# Patient Record
Sex: Female | Born: 1985 | Race: White | Hispanic: No | Marital: Married | Smoking: Never smoker
Health system: Southern US, Community
[De-identification: ages and names within clinical notes are randomized; demographics above are authoritative.]

## PROBLEM LIST (undated history)

## (undated) DIAGNOSIS — Z789 Other specified health status: Secondary | ICD-10-CM

## (undated) HISTORY — PX: WISDOM TOOTH EXTRACTION: SHX21

---

## 2009-10-10 ENCOUNTER — Emergency Department (HOSPITAL_COMMUNITY): Admission: EM | Admit: 2009-10-10 | Discharge: 2009-10-11 | Payer: Self-pay | Admitting: Emergency Medicine

## 2009-10-11 ENCOUNTER — Emergency Department (HOSPITAL_COMMUNITY): Admission: EM | Admit: 2009-10-11 | Discharge: 2009-10-11 | Payer: Self-pay | Admitting: Emergency Medicine

## 2009-10-12 ENCOUNTER — Encounter: Payer: Self-pay | Admitting: Emergency Medicine

## 2009-10-12 ENCOUNTER — Inpatient Hospital Stay (HOSPITAL_COMMUNITY): Admission: EM | Admit: 2009-10-12 | Discharge: 2009-10-14 | Payer: Self-pay

## 2010-07-20 LAB — CBC
HCT: 30.2 % — ABNORMAL LOW (ref 36.0–46.0)
HCT: 33 % — ABNORMAL LOW (ref 36.0–46.0)
HCT: 33.5 % — ABNORMAL LOW (ref 36.0–46.0)
Hemoglobin: 11.4 g/dL — ABNORMAL LOW (ref 12.0–15.0)
MCHC: 33.5 g/dL (ref 30.0–36.0)
MCHC: 33.9 g/dL (ref 30.0–36.0)
MCHC: 34.2 g/dL (ref 30.0–36.0)
MCV: 84.8 fL (ref 78.0–100.0)
MCV: 85.2 fL (ref 78.0–100.0)
MCV: 86.2 fL (ref 78.0–100.0)
Platelets: 285 10*3/uL (ref 150–400)
Platelets: 323 10*3/uL (ref 150–400)
Platelets: 336 10*3/uL (ref 150–400)
RBC: 3.34 MIL/uL — ABNORMAL LOW (ref 3.87–5.11)
RBC: 3.51 MIL/uL — ABNORMAL LOW (ref 3.87–5.11)
RDW: 12.6 % (ref 11.5–15.5)
RDW: 12.8 % (ref 11.5–15.5)
WBC: 14.4 10*3/uL — ABNORMAL HIGH (ref 4.0–10.5)

## 2010-07-20 LAB — BASIC METABOLIC PANEL
BUN: 7 mg/dL (ref 6–23)
CO2: 23 mEq/L (ref 19–32)
CO2: 25 mEq/L (ref 19–32)
CO2: 26 mEq/L (ref 19–32)
Calcium: 9.3 mg/dL (ref 8.4–10.5)
Chloride: 104 mEq/L (ref 96–112)
Chloride: 106 mEq/L (ref 96–112)
Creatinine, Ser: 0.66 mg/dL (ref 0.4–1.2)
GFR calc Af Amer: >60 mL/min (ref >60)
GFR calc Af Amer: >60 mL/min (ref >60)
Glucose, Bld: 106 mg/dL — ABNORMAL HIGH (ref 70–99)
Potassium: 3.5 mEq/L (ref 3.5–5.1)
Potassium: 3.7 mEq/L (ref 3.5–5.1)
Sodium: 135 mEq/L (ref 135–145)

## 2010-07-20 LAB — CULTURE, BLOOD (ROUTINE X 2)
Report Status: 6152011
Report Status: 6152011

## 2010-07-20 LAB — DIFFERENTIAL
Basophils Absolute: 0 10*3/uL (ref 0.0–0.1)
Eosinophils Absolute: 0 10*3/uL (ref 0.0–0.7)
Eosinophils Relative: 0 % (ref 0–5)
Eosinophils Relative: 0 % (ref 0–5)
Lymphocytes Relative: 16 % (ref 12–46)
Lymphs Abs: 2.3 10*3/uL (ref 0.7–4.0)
Monocytes Absolute: 0.8 10*3/uL (ref 0.1–1.0)

## 2010-07-20 LAB — CULTURE, ROUTINE-ABSCESS: Culture: NO GROWTH

## 2010-07-20 LAB — URINALYSIS, ROUTINE W REFLEX MICROSCOPIC
Bilirubin Urine: NEGATIVE
Glucose, UA: NEGATIVE mg/dL
Hgb urine dipstick: NEGATIVE
Ketones, ur: NEGATIVE mg/dL
pH: 6 (ref 5.0–8.0)

## 2010-07-20 LAB — ANAEROBIC CULTURE

## 2010-07-20 LAB — PROTIME-INR: Prothrombin Time: 13.8 seconds (ref 11.6–15.2)

## 2011-08-17 IMAGING — CT CT MAXILLOFACIAL W/ CM
3 of 5 series · 16 of 47 positions shown, 19 images · IV contrast (Omnipaque 300)
Comparison: None.

CLINICAL DATA: Left jaw swelling. Fever.

CT MAXILLOFACIAL WITH CONTRAST
TECHNIQUE: Multidetector CT imaging of the maxillofacial
structures was performed with intravenous contrast. Multiplanar CT
image reconstructions were also generated.
Contrast: 75 ml [id number].

[Series 2: max st axial · axial · 0.33mm/px · z∈[+29,+159]mm · 11 of 76 slices shown, 14 images]
[im 7/76  brain]
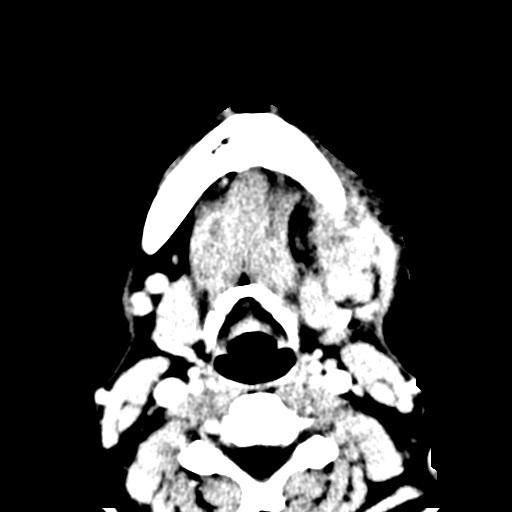
[im 7/76  bone]
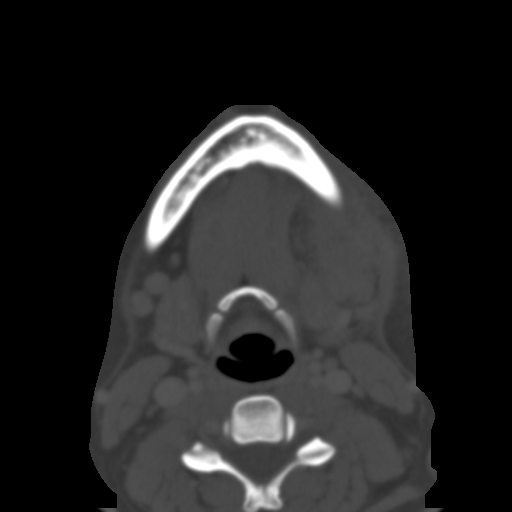
[im 14/76  bone]
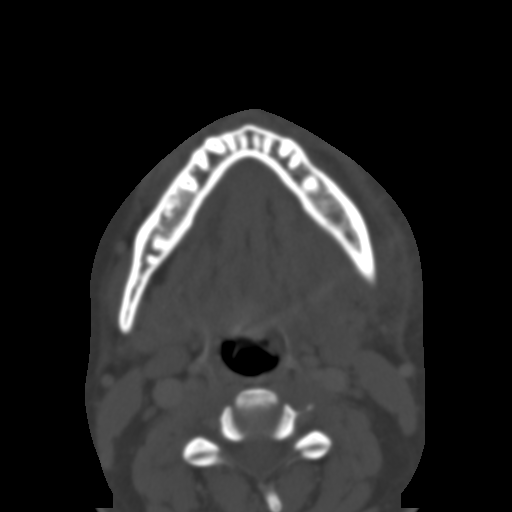
[im 20/76  bone]
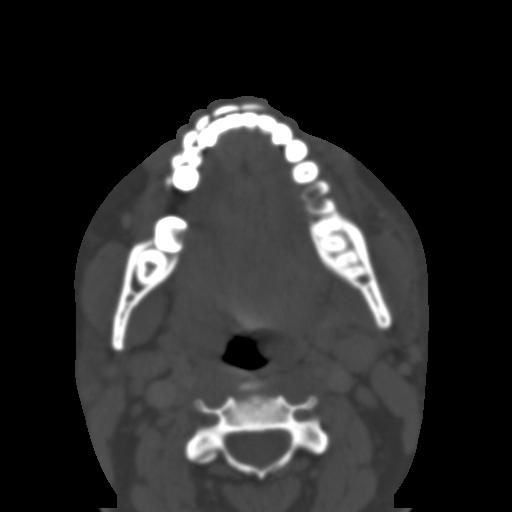
[im 27/76  bone]
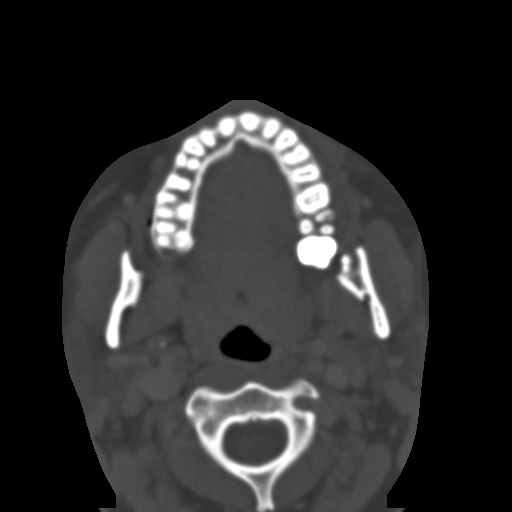
[im 33/76  brain]
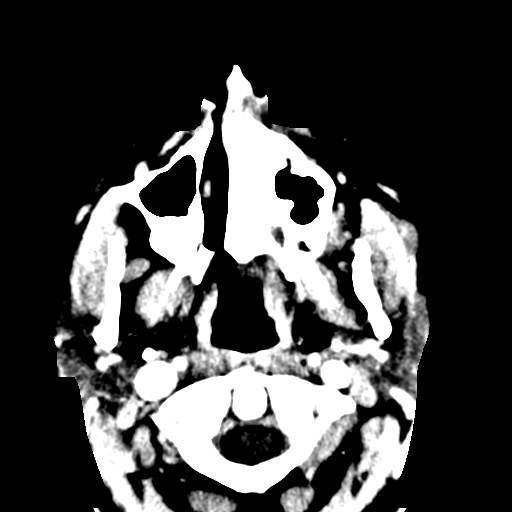
[im 33/76  bone]
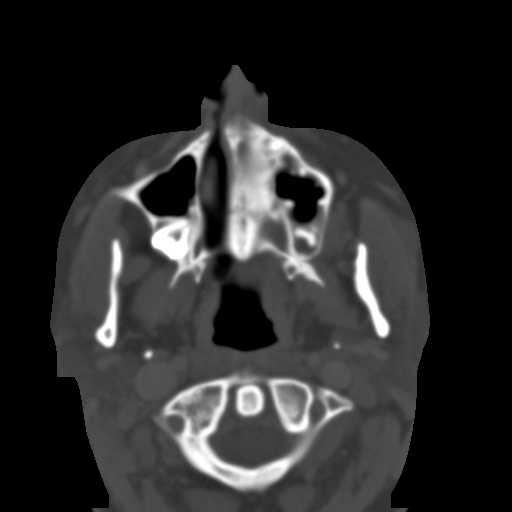
[im 40/76  bone]
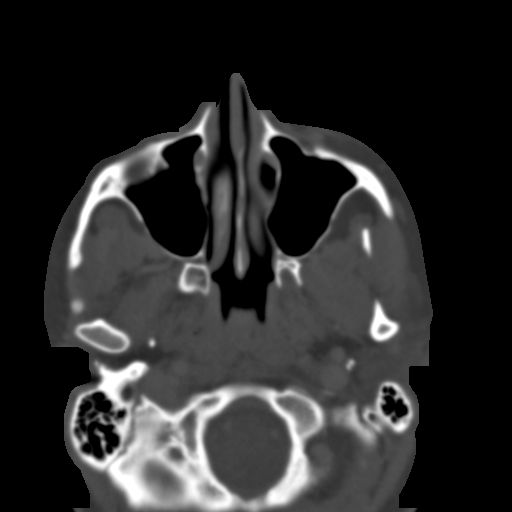
[im 46/76  bone]
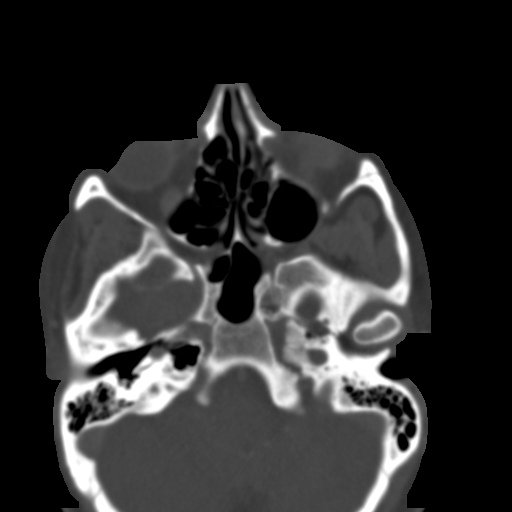
[im 53/76  bone]
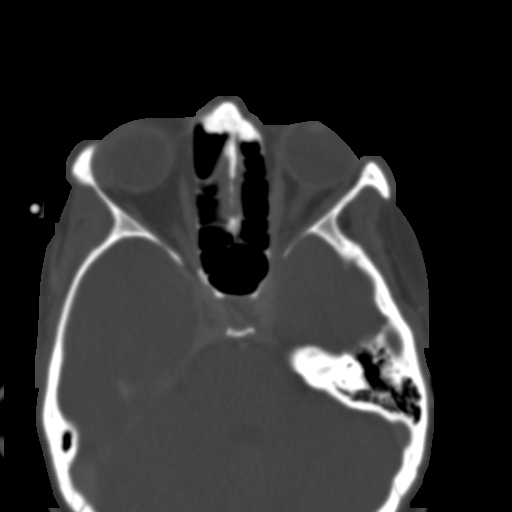
[im 59/76  brain]
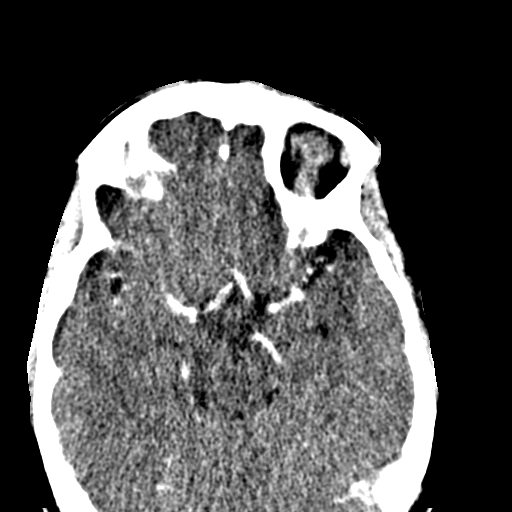
[im 59/76  bone]
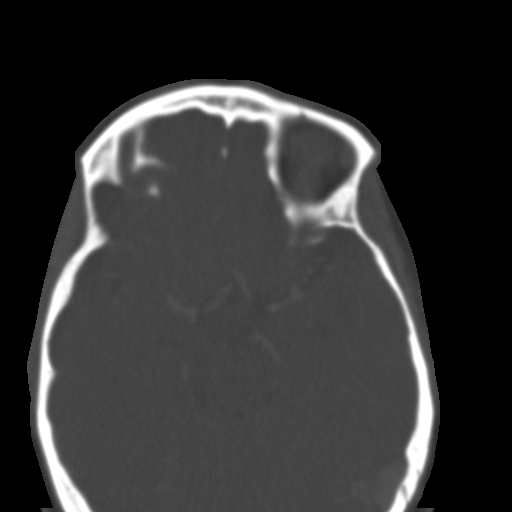
[im 66/76  bone]
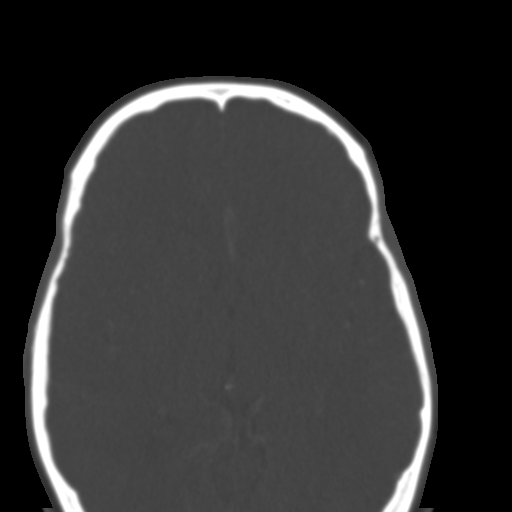
[im 72/76  bone]
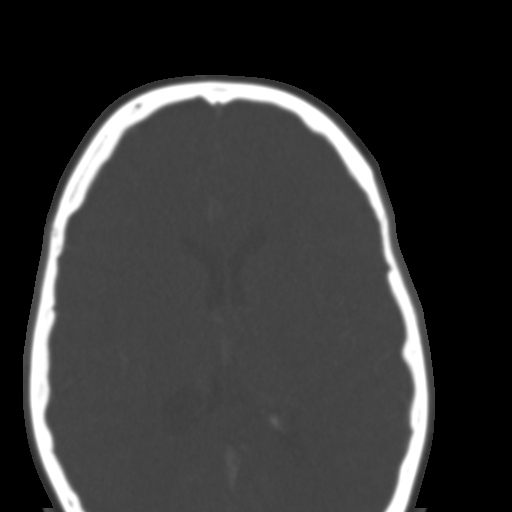

[Series 5: max st coro · coronal · 0.28mm/px · 3 of 79 slices shown]
[im 27/79  bone]
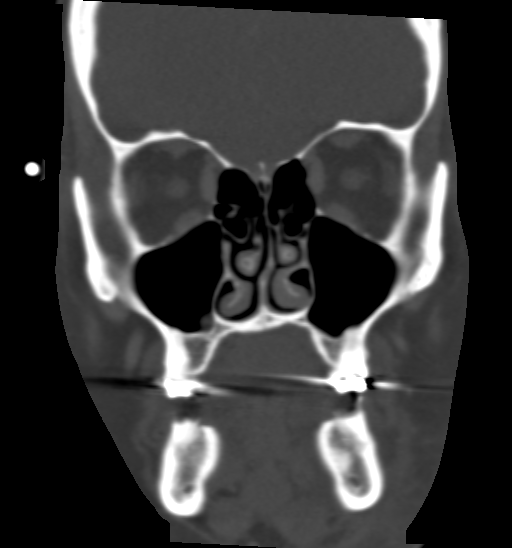
[im 35/79  bone]
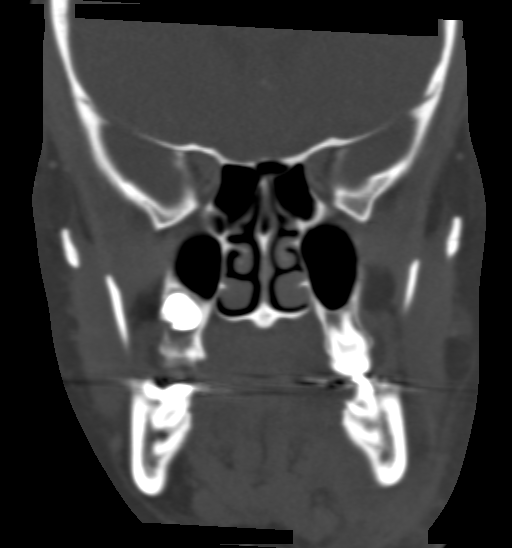
[im 44/79  bone]
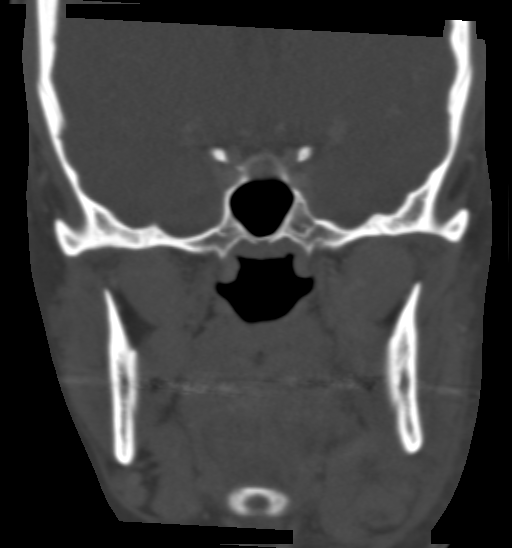

[Series 8: max bone sag · sagittal · 0.30mm/px · 2 of 106 slices shown]
[im 36/106  bone]
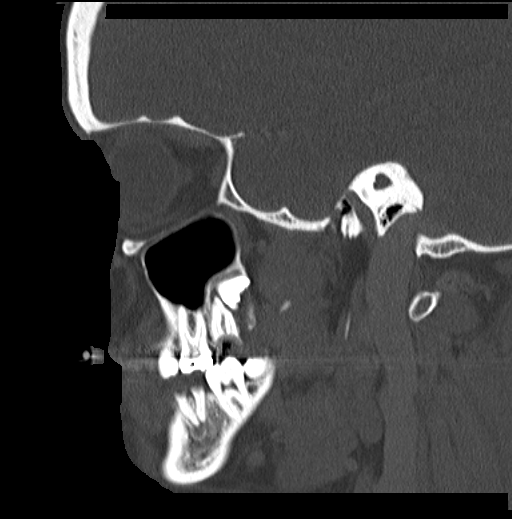
[im 71/106  bone]
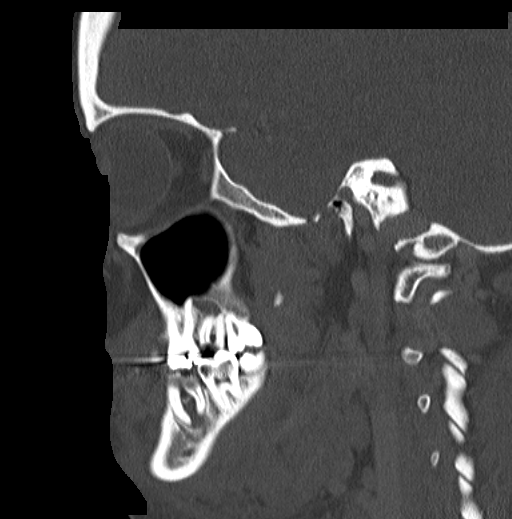

[16 of 47 positions shown; findings below may reference images not displayed]

FINDINGS: There is bilateral periodontal disease with periapical
lucencies throughout the teeth in the mandible, greater on the
left.  There is a focal area of medial mandibular cortical
dehiscence related to the second and third molars on the left
(series 4, images [id number], associated with mild periosteal
reaction.  Inferior and medial to this there is a ovoid soft tissue
density projecting below the mandibular ramus on the left. The left
submandibular gland is displaced medially and posterior.  There is
stranding of the subcutaneous soft tissues.  Airway is patent.
There is no sinus or orbital findings. Cross-sectional measurements
are 31 x 26 by 20 mm.  Findings consistent with left mandibular
osteomyelitis and inferior mandibular abscess related to
periodontal disease.
IMPRESSION: Multiple areas of periapical lucency in the mandible, left greater
than right, with a focal area of left mandibular cortical
dehiscence and periosteal reaction and associated soft tissue
density; findings consistent with left mandibular osteomyelitis and
submandibular abscess.

## 2016-05-03 NOTE — L&D Delivery Note (Signed)
Delivery Note At 1:56 PM a viable female was delivered via Vaginal, Spontaneous NSVD  APGAR: 9 9  Placenta status: spontaneously with 3 vessel cord  Cord:  with the following complications:nuchal cord x 2 .  Cord pH: not obtained 2 small skin tags removed from right side of vulva per patient request Anesthesia:  epidural Episiotomy:  none Lacerations:  periclitoral Suture Repair: 3.0 chromic Est. Blood Loss (mL):  300  Mom to postpartum.  Baby to Couplet care / Skin to Skin.  Nieves Chapa L 04/20/2017, 2:26 PM

## 2016-09-20 LAB — OB RESULTS CONSOLE HIV ANTIBODY (ROUTINE TESTING): HIV: NONREACTIVE

## 2016-09-20 LAB — OB RESULTS CONSOLE ANTIBODY SCREEN: Antibody Screen: NEGATIVE

## 2016-09-20 LAB — OB RESULTS CONSOLE ABO/RH: RH Type: POSITIVE

## 2016-09-20 LAB — OB RESULTS CONSOLE RUBELLA ANTIBODY, IGM: RUBELLA: NON-IMMUNE/NOT IMMUNE

## 2016-09-20 LAB — OB RESULTS CONSOLE GC/CHLAMYDIA
Chlamydia: NEGATIVE
Gonorrhea: NEGATIVE

## 2016-09-20 LAB — OB RESULTS CONSOLE HEPATITIS B SURFACE ANTIGEN: Hepatitis B Surface Ag: NEGATIVE

## 2016-09-20 LAB — OB RESULTS CONSOLE RPR: RPR: NONREACTIVE

## 2017-03-27 ENCOUNTER — Inpatient Hospital Stay (HOSPITAL_COMMUNITY): Admission: AD | Admit: 2017-03-27 | Payer: Self-pay | Source: Ambulatory Visit | Admitting: Obstetrics and Gynecology

## 2017-04-20 ENCOUNTER — Inpatient Hospital Stay (HOSPITAL_COMMUNITY)
Admission: RE | Admit: 2017-04-20 | Discharge: 2017-04-22 | DRG: 768 | Disposition: A | Discharge status: Home / Self Care | Payer: 59 | Source: Ambulatory Visit | Attending: Obstetrics and Gynecology | Admitting: Obstetrics and Gynecology

## 2017-04-20 ENCOUNTER — Inpatient Hospital Stay (HOSPITAL_COMMUNITY): Payer: 59 | Admitting: Anesthesiology

## 2017-04-20 ENCOUNTER — Other Ambulatory Visit: Payer: Self-pay

## 2017-04-20 ENCOUNTER — Encounter (HOSPITAL_COMMUNITY): Payer: Self-pay | Admitting: *Deleted

## 2017-04-20 DIAGNOSIS — L918 Other hypertrophic disorders of the skin: Secondary | ICD-10-CM | POA: Diagnosis present

## 2017-04-20 DIAGNOSIS — Z3A38 38 weeks gestation of pregnancy: Secondary | ICD-10-CM | POA: Diagnosis not present

## 2017-04-20 DIAGNOSIS — O134 Gestational [pregnancy-induced] hypertension without significant proteinuria, complicating childbirth: Principal | ICD-10-CM | POA: Diagnosis present

## 2017-04-20 DIAGNOSIS — O139 Gestational [pregnancy-induced] hypertension without significant proteinuria, unspecified trimester: Secondary | ICD-10-CM | POA: Diagnosis present

## 2017-04-20 HISTORY — DX: Other specified health status: Z78.9

## 2017-04-20 LAB — COMPREHENSIVE METABOLIC PANEL
ALBUMIN: 2.7 g/dL — AB (ref 3.5–5.0)
ALT: 15 U/L (ref 14–54)
AST: 19 U/L (ref 15–41)
Alkaline Phosphatase: 110 U/L (ref 38–126)
Anion gap: 12 (ref 5–15)
BILIRUBIN TOTAL: 0.2 mg/dL — AB (ref 0.3–1.2)
BUN: 11 mg/dL (ref 6–20)
CO2: 18 mmol/L — AB (ref 22–32)
Calcium: 9.2 mg/dL (ref 8.9–10.3)
Chloride: 103 mmol/L (ref 101–111)
Creatinine, Ser: 0.46 mg/dL (ref 0.44–1.00)
GFR calc Af Amer: >60 mL/min (ref >60)
GFR calc non Af Amer: >60 mL/min (ref >60)
GLUCOSE: 78 mg/dL (ref 65–99)
POTASSIUM: 4.2 mmol/L (ref 3.5–5.1)
SODIUM: 133 mmol/L — AB (ref 135–145)
TOTAL PROTEIN: 6 g/dL — AB (ref 6.5–8.1)

## 2017-04-20 LAB — CBC
HEMATOCRIT: 31.1 % — AB (ref 36.0–46.0)
HEMATOCRIT: 31 % — AB (ref 36.0–46.0)
HEMOGLOBIN: 10.4 g/dL — AB (ref 12.0–15.0)
HEMOGLOBIN: 10.2 g/dL — AB (ref 12.0–15.0)
MCH: 29.1 pg (ref 26.0–34.0)
MCH: 28.9 pg (ref 26.0–34.0)
MCHC: 33.4 g/dL (ref 30.0–36.0)
MCHC: 32.9 g/dL (ref 30.0–36.0)
MCV: 87.1 fL (ref 78.0–100.0)
MCV: 87.8 fL (ref 78.0–100.0)
PLATELETS: 258 10*3/uL (ref 150–400)
Platelets: 274 10*3/uL (ref 150–400)
RBC: 3.57 MIL/uL — ABNORMAL LOW (ref 3.87–5.11)
RBC: 3.53 MIL/uL — AB (ref 3.87–5.11)
RDW: 14.3 % (ref 11.5–15.5)
RDW: 14.3 % (ref 11.5–15.5)
WBC: 14.2 10*3/uL — AB (ref 4.0–10.5)
WBC: 12.8 10*3/uL — ABNORMAL HIGH (ref 4.0–10.5)

## 2017-04-20 LAB — TYPE AND SCREEN
ABO/RH(D): O POS
Antibody Screen: NEGATIVE

## 2017-04-20 LAB — ABO/RH: ABO/RH(D): O POS

## 2017-04-20 LAB — RPR: RPR Ser Ql: NONREACTIVE

## 2017-04-20 MED ORDER — DIPHENHYDRAMINE HCL 25 MG PO CAPS: 25.0000 mg | ORAL_CAPSULE | Freq: Four times a day (QID) | ORAL | Status: DC | PRN

## 2017-04-20 MED ORDER — OXYTOCIN 40 UNITS IN LACTATED RINGERS INFUSION - SIMPLE MED: 2.5000 [IU]/h | INTRAVENOUS | Status: DC

## 2017-04-20 MED ORDER — OXYTOCIN 40 UNITS IN LACTATED RINGERS INFUSION - SIMPLE MED
1.0000 m[IU]/min | INTRAVENOUS | Status: DC
  Administered 2017-04-20: 2 m[IU]/min via INTRAVENOUS
  Filled 2017-04-20: qty 1000

## 2017-04-20 MED ORDER — MISOPROSTOL 25 MCG QUARTER TABLET
25.0000 ug | ORAL_TABLET | ORAL | Status: DC | PRN
  Administered 2017-04-20: 25 ug via VAGINAL
  Filled 2017-04-20 (×3): qty 1

## 2017-04-20 MED ORDER — ONDANSETRON HCL 4 MG/2ML IJ SOLN: 4.0000 mg | INTRAMUSCULAR | Status: DC | PRN

## 2017-04-20 MED ORDER — ACETAMINOPHEN 325 MG PO TABS: 650.0000 mg | ORAL_TABLET | ORAL | Status: DC | PRN

## 2017-04-20 MED ORDER — OXYTOCIN BOLUS FROM INFUSION
500.0000 mL | Freq: Once | INTRAVENOUS | Status: AC
  Administered 2017-04-20: 500 mL via INTRAVENOUS

## 2017-04-20 MED ORDER — ZOLPIDEM TARTRATE 5 MG PO TABS: 5.0000 mg | ORAL_TABLET | Freq: Every evening | ORAL | Status: DC | PRN

## 2017-04-20 MED ORDER — LACTATED RINGERS IV SOLN: 500.0000 mL | INTRAVENOUS | Status: DC | PRN

## 2017-04-20 MED ORDER — OXYCODONE-ACETAMINOPHEN 5-325 MG PO TABS: 2.0000 | ORAL_TABLET | ORAL | Status: DC | PRN

## 2017-04-20 MED ORDER — LIDOCAINE HCL (PF) 1 % IJ SOLN
INTRAMUSCULAR | Status: DC | PRN
Start: 2017-04-20 — End: 2017-04-20
  Administered 2017-04-20 (×2): 5 mL

## 2017-04-20 MED ORDER — IBUPROFEN 600 MG PO TABS
600.0000 mg | ORAL_TABLET | Freq: Four times a day (QID) | ORAL | Status: DC
  Administered 2017-04-20 – 2017-04-22 (×8): 600 mg via ORAL
  Filled 2017-04-20 (×8): qty 1

## 2017-04-20 MED ORDER — LIDOCAINE HCL (PF) 1 % IJ SOLN
30.0000 mL | INTRAMUSCULAR | Status: DC | PRN
  Filled 2017-04-20: qty 30

## 2017-04-20 MED ORDER — TETANUS-DIPHTH-ACELL PERTUSSIS 5-2.5-18.5 LF-MCG/0.5 IM SUSP: 0.5000 mL | Freq: Once | INTRAMUSCULAR | Status: DC

## 2017-04-20 MED ORDER — MEDROXYPROGESTERONE ACETATE 150 MG/ML IM SUSP: 150.0000 mg | INTRAMUSCULAR | Status: DC | PRN

## 2017-04-20 MED ORDER — SENNOSIDES-DOCUSATE SODIUM 8.6-50 MG PO TABS
2.0000 | ORAL_TABLET | ORAL | Status: DC
  Administered 2017-04-21 (×2): 2 via ORAL
  Filled 2017-04-20 (×2): qty 2

## 2017-04-20 MED ORDER — LACTATED RINGERS IV SOLN
INTRAVENOUS | Status: DC
  Administered 2017-04-20: 02:00:00 via INTRAVENOUS

## 2017-04-20 MED ORDER — SIMETHICONE 80 MG PO CHEW: 80.0000 mg | CHEWABLE_TABLET | ORAL | Status: DC | PRN

## 2017-04-20 MED ORDER — DIBUCAINE 1 % RE OINT: 1.0000 "application " | TOPICAL_OINTMENT | RECTAL | Status: DC | PRN

## 2017-04-20 MED ORDER — OXYCODONE-ACETAMINOPHEN 5-325 MG PO TABS: 1.0000 | ORAL_TABLET | ORAL | Status: DC | PRN

## 2017-04-20 MED ORDER — BENZOCAINE-MENTHOL 20-0.5 % EX AERO
1.0000 "application " | INHALATION_SPRAY | CUTANEOUS | Status: DC | PRN
  Administered 2017-04-20: 1 via TOPICAL
  Filled 2017-04-20: qty 56

## 2017-04-20 MED ORDER — TERBUTALINE SULFATE 1 MG/ML IJ SOLN
0.2500 mg | Freq: Once | INTRAMUSCULAR | Status: DC | PRN
  Filled 2017-04-20: qty 1

## 2017-04-20 MED ORDER — ONDANSETRON HCL 4 MG/2ML IJ SOLN: 4.0000 mg | Freq: Four times a day (QID) | INTRAMUSCULAR | Status: DC | PRN

## 2017-04-20 MED ORDER — FLEET ENEMA 7-19 GM/118ML RE ENEM: 1.0000 | ENEMA | Freq: Every day | RECTAL | Status: DC | PRN

## 2017-04-20 MED ORDER — WITCH HAZEL-GLYCERIN EX PADS: 1.0000 "application " | MEDICATED_PAD | CUTANEOUS | Status: DC | PRN

## 2017-04-20 MED ORDER — LACTATED RINGERS IV SOLN
500.0000 mL | Freq: Once | INTRAVENOUS | Status: AC
  Administered 2017-04-20: 250 mL via INTRAVENOUS

## 2017-04-20 MED ORDER — PRENATAL MULTIVITAMIN CH
1.0000 | ORAL_TABLET | Freq: Every day | ORAL | Status: DC
  Administered 2017-04-21 – 2017-04-22 (×2): 1 via ORAL
  Filled 2017-04-20 (×2): qty 1

## 2017-04-20 MED ORDER — EPHEDRINE 5 MG/ML INJ
10.0000 mg | INTRAVENOUS | Status: DC | PRN
  Filled 2017-04-20: qty 2

## 2017-04-20 MED ORDER — COCONUT OIL OIL: 1.0000 "application " | TOPICAL_OIL | Status: DC | PRN

## 2017-04-20 MED ORDER — MEASLES, MUMPS & RUBELLA VAC ~~LOC~~ INJ
0.5000 mL | INJECTION | Freq: Once | SUBCUTANEOUS | Status: AC
  Administered 2017-04-22: 0.5 mL via SUBCUTANEOUS
  Filled 2017-04-20 (×2): qty 0.5

## 2017-04-20 MED ORDER — DIPHENHYDRAMINE HCL 50 MG/ML IJ SOLN: 12.5000 mg | INTRAMUSCULAR | Status: DC | PRN

## 2017-04-20 MED ORDER — PHENYLEPHRINE 40 MCG/ML (10ML) SYRINGE FOR IV PUSH (FOR BLOOD PRESSURE SUPPORT)
80.0000 ug | PREFILLED_SYRINGE | INTRAVENOUS | Status: DC | PRN
  Filled 2017-04-20: qty 10
  Filled 2017-04-20: qty 5

## 2017-04-20 MED ORDER — PHENYLEPHRINE 40 MCG/ML (10ML) SYRINGE FOR IV PUSH (FOR BLOOD PRESSURE SUPPORT)
80.0000 ug | PREFILLED_SYRINGE | INTRAVENOUS | Status: DC | PRN
  Filled 2017-04-20: qty 5

## 2017-04-20 MED ORDER — BISACODYL 10 MG RE SUPP: 10.0000 mg | Freq: Every day | RECTAL | Status: DC | PRN

## 2017-04-20 MED ORDER — SOD CITRATE-CITRIC ACID 500-334 MG/5ML PO SOLN
30.0000 mL | ORAL | Status: DC | PRN
  Administered 2017-04-20: 30 mL via ORAL
  Filled 2017-04-20: qty 15

## 2017-04-20 MED ORDER — FENTANYL 2.5 MCG/ML BUPIVACAINE 1/10 % EPIDURAL INFUSION (WH - ANES)
14.0000 mL/h | INTRAMUSCULAR | Status: DC | PRN
  Administered 2017-04-20: 14 mL/h via EPIDURAL
  Filled 2017-04-20: qty 100

## 2017-04-20 MED ORDER — ONDANSETRON HCL 4 MG PO TABS: 4.0000 mg | ORAL_TABLET | ORAL | Status: DC | PRN

## 2017-04-20 NOTE — Anesthesia Postprocedure Evaluation (Signed)
Anesthesia Post Note  Patient: Rachael Barrera  Procedure(s) Performed: AN AD HOC LABOR EPIDURAL     Patient location during evaluation: Mother Baby Anesthesia Type: Epidural Level of consciousness: awake Pain management: pain level controlled Vital Signs Assessment: post-procedure vital signs reviewed and stable Respiratory status: spontaneous breathing Cardiovascular status: stable Postop Assessment: no headache, epidural receding and patient able to bend at knees Anesthetic complications: no    Last Vitals:  Vitals:   04/20/17 1600 04/20/17 1657  BP: 130/77 130/73  Pulse: 75 73  Resp: 18 20  Temp: 36.9 C 37.2 C  SpO2:      Last Pain:  Vitals:   04/20/17 1657  TempSrc: Oral  PainSc:    Pain Goal: Patients Stated Pain Goal: 7 (04/20/17 0756)               Edison PaceWILKERSON,Harding Thomure

## 2017-04-20 NOTE — Progress Notes (Signed)
Patient given MMR VIS. 

## 2017-04-20 NOTE — H&P (Signed)
Trecia RogersJessica B Bonadonna is a 31 y.o. G 1 P 0 at 5438 w 4 days presents for induction secondary to gestational hypertension. BPs in office a couple of visits 150/90s Received cytotec last night Family History: family history is not on file. Social History:  reports that  has never smoked. She does not have any smokeless tobacco history on file. She reports that she does not drink alcohol or use drugs.     Maternal Diabetes: No Genetic Screening: Normal Maternal Ultrasounds/Referrals: Normal Fetal Ultrasounds or other Referrals:  None Maternal Substance Abuse:  No Significant Maternal Medications:  None Significant Maternal Lab Results:  None Other Comments:  None  Review of Systems  All other systems reviewed and are negative.  Maternal Medical History:  Prenatal complications: no prenatal complications   Dilation: 5 Effacement (%): 80 Station: -2 Exam by:: Shyanne Mcclary Blood pressure (!) 141/90, pulse 66, temperature 98 F (36.7 C), temperature source Oral, resp. rate 18, height 5\' 4"  (1.626 m), weight 107 kg (236 lb). Maternal Exam:  Abdomen: Fetal presentation: vertex     Fetal Exam Fetal State Assessment: Category I - tracings are normal.     Physical Exam  Nursing note and vitals reviewed. Constitutional: She appears well-developed and well-nourished.  HENT:  Head: Normocephalic.  Eyes: Pupils are equal, round, and reactive to light.  Neck: Normal range of motion.  Cardiovascular: Normal rate and regular rhythm.  GI: Soft.    Prenatal labs: ABO, Rh: --/--/O POS, O POS (12/19 0145) Antibody: NEG (12/19 0145) Rubella: Nonimmune (05/21 0000) RPR: Nonreactive (05/21 0000)  HBsAg: Negative (05/21 0000)  HIV: Non-reactive (05/21 0000)  GBS:     Results for orders placed or performed during the hospital encounter of 04/20/17 (from the past 24 hour(s))  CBC     Status: Abnormal   Collection Time: 04/20/17  1:45 AM  Result Value Ref Range   WBC 14.2 (H) 4.0 - 10.5 K/uL   RBC 3.57 (L) 3.87 - 5.11 MIL/uL   Hemoglobin 10.4 (L) 12.0 - 15.0 g/dL   HCT 69.631.1 (L) 29.536.0 - 28.446.0 %   MCV 87.1 78.0 - 100.0 fL   MCH 29.1 26.0 - 34.0 pg   MCHC 33.4 30.0 - 36.0 g/dL   RDW 13.214.3 44.011.5 - 10.215.5 %   Platelets 274 150 - 400 K/uL  Type and screen [facility]     Status: None   Collection Time: 04/20/17  1:45 AM  Result Value Ref Range   ABO/RH(D) O POS    Antibody Screen NEG    Sample Expiration 04/23/2017   Comprehensive metabolic panel     Status: Abnormal   Collection Time: 04/20/17  1:45 AM  Result Value Ref Range   Sodium 133 (L) 135 - 145 mmol/L   Potassium 4.2 3.5 - 5.1 mmol/L   Chloride 103 101 - 111 mmol/L   CO2 18 (L) 22 - 32 mmol/L   Glucose, Bld 78 65 - 99 mg/dL   BUN 11 6 - 20 mg/dL   Creatinine, Ser 7.250.46 0.44 - 1.00 mg/dL   Calcium 9.2 8.9 - 36.610.3 mg/dL   Total Protein 6.0 (L) 6.5 - 8.1 g/dL   Albumin 2.7 (L) 3.5 - 5.0 g/dL   AST 19 15 - 41 U/L   ALT 15 14 - 54 U/L   Alkaline Phosphatase 110 38 - 126 U/L   Total Bilirubin 0.2 (L) 0.3 - 1.2 mg/dL   GFR calc non Af Amer >60 >60 mL/min  GFR calc Af Amer >60 >60 mL/min   Anion gap 12 5 - 15  ABO/Rh     Status: None   Collection Time: 04/20/17  1:45 AM  Result Value Ref Range   ABO/RH(D) O POS     Assessment/Plan: IUP at 38 4/7 Gestational hypertension Epidural Continue Pitocin   Mazzie Brodrick L 04/20/2017, 7:53 AM

## 2017-04-20 NOTE — Anesthesia Pain Management Evaluation Note (Signed)
  [facility]NA Pain Management Visit Note  Patient: Rachael Barrera, 31 y.o., female  "Hello [facility] am [facility]. We have an anesthesia team available at all times to provide care throughout the hospital, including epidural management and anesthesia for C-section. I don't know your plan for the delivery whether it a natural birth, water birth, IV sedation, nitrous supplementation, doula or epidural, but we want to meet your pain goals."   1.Was your pain managed to your expectations on prior hospitalizations?   No prior hospitalizations  2.What is your expectation for pain management during this hospitalization?     Epidural  3.How can we help you reach that goal?   Record the patient's initial score and the patient's pain goal.   Pain: 4  Pain Goal: 7 [facility] wants you to be able to say your pain was always managed very well.  Laban EmperorMalinova,Rachael Barrera 04/20/2017

## 2017-04-20 NOTE — Progress Notes (Signed)
Patient states her father has cancer and is currently on chemo, sometimes he gets aggressive and needs to be asked to leave. Asked patient to let me know when he arrives and when she needs assistance.

## 2017-04-20 NOTE — Anesthesia Preprocedure Evaluation (Signed)

## 2017-04-20 NOTE — Anesthesia Procedure Notes (Signed)
Epidural Patient location during procedure: OB  Staffing Anesthesiologist: Luretta Everly, MD Performed: anesthesiologist   Preanesthetic Checklist Completed: patient identified, site marked, surgical consent, pre-op evaluation, timeout performed, IV checked, risks and benefits discussed and monitors and equipment checked  Epidural Patient position: sitting Prep: DuraPrep Patient monitoring: heart rate, continuous pulse ox and blood pressure Approach: right paramedian Location: L3-L4 Injection technique: LOR saline  Needle:  Needle type: Tuohy  Needle gauge: 17 G Needle length: 9 cm and 9 Needle insertion depth: 5 cm Catheter type: closed end flexible Catheter size: 20 Guage Catheter at skin depth: 9 cm Test dose: negative  Assessment Events: blood not aspirated, injection not painful, no injection resistance, negative IV test and no paresthesia  Additional Notes Patient identified. Risks/Benefits/Options discussed with patient including but not limited to bleeding, infection, nerve damage, paralysis, failed block, incomplete pain control, headache, blood pressure changes, nausea, vomiting, reactions to medication both or allergic, itching and postpartum back pain. Confirmed with bedside nurse the patient's most recent platelet count. Confirmed with patient that they are not currently taking any anticoagulation, have any bleeding history or any family history of bleeding disorders. Patient expressed understanding and wished to proceed. All questions were answered. Sterile technique was used throughout the entire procedure. Please see nursing notes for vital signs. Test dose was given through epidural needle and negative prior to continuing to dose epidural or start infusion. Warning signs of high block given to the patient including shortness of breath, tingling/numbness in hands, complete motor block, or any concerning symptoms with instructions to call for help. Patient was given  instructions on fall risk and not to get out of bed. All questions and concerns addressed with instructions to call with any issues.     

## 2017-04-21 LAB — RUBELLA SCREEN

## 2017-04-21 LAB — CBC
HEMATOCRIT: 27.8 % — AB (ref 36.0–46.0)
HEMOGLOBIN: 9.3 g/dL — AB (ref 12.0–15.0)
MCH: 29.1 pg (ref 26.0–34.0)
MCHC: 33.5 g/dL (ref 30.0–36.0)
MCV: 86.9 fL (ref 78.0–100.0)
Platelets: 226 10*3/uL (ref 150–400)
RBC: 3.2 MIL/uL — AB (ref 3.87–5.11)
RDW: 14.5 % (ref 11.5–15.5)
WBC: 12.7 10*3/uL — ABNORMAL HIGH (ref 4.0–10.5)

## 2017-04-21 NOTE — Progress Notes (Signed)
Post Partum Day 1 Subjective: no complaints, up ad lib, voiding and tolerating PO  Objective: Blood pressure 134/79, pulse 80, temperature 98.3 F (36.8 C), temperature source Oral, resp. rate 18, height 5\' 4"  (1.626 m), weight 236 lb (107 kg), SpO2 99 %, unknown if currently breastfeeding.  Physical Exam:  General: alert, cooperative and no distress Lochia: appropriate Uterine Fundus: firm Incision: healing well DVT Evaluation: No evidence of DVT seen on physical exam.  Recent Labs    04/20/17 0803 04/21/17 0524  HGB 10.2* 9.3*  HCT 31.0* 27.8*    Assessment/Plan: Plan for discharge tomorrow   LOS: 1 day   Rachael LocusJames E Kerria Sapien II 04/21/2017, 7:57 AM

## 2017-04-21 NOTE — Lactation Note (Signed)
This note was copied from a baby's chart. Lactation Consultation Note Baby 13 hrs old. Mom crying holding baby STS in cradle position "saying he wont eat." explaining to mom newborn feeding habits, behavior, STS, I&O, cluster feeding, positioning, props, supply and demand. Mom calmed down, stopped crying. Baby is very spitty. Explained to mom that's why baby isn't hungry at this time.  Mom has large breast w/flat nipples. Rt. Nipple compressible and everts w/stimulation. Lt. Breast, areola, and nipple w/edema. Not compressible. Reverse pressure slightly helpful. Mom has generalized edema. Encouraged mom to wear shells that's given. Hand pump given to pre-pump nipple. Fitted mom w/#20 NS, mainly for Lt. Nipple. LC feels that Rt. Nipple compressible for latching. Baby at this time not interested in BF. LC cleared bubbles from mouth. Encouraged FOB to hold baby upright.  Hand expression taught w/361ml colostrum. Discussed spoon feeding.  Mom shown how to use DEBP & how to disassemble, clean, & reassemble parts. Mom knows to pump q3h for 15-20 min.  Mom encouraged to feed baby 8-12 times/24 hours and with feeding cues. Discussed trying to BF every 3 hrs if hasn't cued. Report to RN if baby hasn't shown interest by 24 hrs of delivery. Baby should be stop being spitty by then.  WH/LC brochure given w/resources, support groups and LC services.  Patient Name: Boy Yehuda BuddJessica Leaks ZHYQM'VToday's Date: 04/21/2017 Reason for consult: Initial assessment   Maternal Data Has patient been taught Hand Expression?: Yes Does the patient have breastfeeding experience prior to this delivery?: No  Feeding Feeding Type: Breast Fed Length of feed: 2 min  LATCH Score Latch: Too sleepy or reluctant, no latch achieved, no sucking elicited.  Audible Swallowing: None  Type of Nipple: Flat  Comfort (Breast/Nipple): Soft / non-tender  Hold (Positioning): Full assist, staff holds infant at breast  LATCH Score:  3  Interventions Interventions: Breast feeding basics reviewed;Breast compression;Assisted with latch;Adjust position;Hand pump;Skin to skin;Support pillows;DEBP;Breast massage;Position options;Hand express;Expressed milk;Pre-pump if needed;Shells;Reverse pressure  Lactation Tools Discussed/Used Tools: Shells;Pump;Nipple Shields Nipple shield size: 20 Shell Type: Inverted Breast pump type: Double-Electric Breast Pump;Manual Pump Review: Setup, frequency, and cleaning;Milk Storage Initiated by:: Peri JeffersonL. Mayling Aber RN IBCLC Date initiated:: 04/21/17   Consult Status Consult Status: Follow-up Date: 04/21/17 Follow-up type: In-patient    Charyl DancerCARVER, Brookelle Pellicane G 04/21/2017, 3:41 AM

## 2017-04-22 NOTE — Discharge Summary (Signed)
Obstetric Discharge Summary Reason for Admission: induction of labor Prenatal Procedures: none Intrapartum Procedures: spontaneous vaginal delivery Postpartum Procedures: none Complications-Operative and Postpartum: none Hemoglobin  Date Value Ref Range Status  04/21/2017 9.3 (L) 12.0 - 15.0 g/dL Final   HCT  Date Value Ref Range Status  04/21/2017 27.8 (L) 36.0 - 46.0 % Final    Physical Exam:  General: alert, cooperative and appears stated age Lochia: appropriate Uterine Fundus: firm Incision: N/A DVT Evaluation: No evidence of DVT seen on physical exam. Negative Homan's sign. No cords or calf tenderness. No significant calf/ankle edema.  Discharge Diagnoses: Term Pregnancy-delivered  Discharge Information: Date: 04/22/2017 Activity: pelvic rest Diet: routine Medications: None Condition: stable Instructions: refer to practice specific booklet Discharge to: home   Newborn Data: Live born female  Birth Weight: 7 lb 7.8 oz (3395 g) APGAR: 6, 8  Newborn Delivery   Birth date/time:  04/20/2017 13:56:00 Delivery type:  Vaginal, Spontaneous     Home with mother.  Rachael Barrera 04/22/2017, 8:16 AM

## 2017-04-22 NOTE — Lactation Note (Signed)
This note was copied from a baby's chart. Lactation Consultation Note  Patient Name: Rachael Barrera YNWGN'FToday's Date: 04/22/2017 Reason for consult: Follow-up assessment   P1, Mother tearful.  Baby resting.  LC will return after mother naps to view latch.   Maternal Data    Feeding Feeding Type: Breast Fed Length of feed: 20 min  LATCH Score                   Interventions    Lactation Tools Discussed/Used     Consult Status Consult Status: Follow-up Date: 04/23/17 Follow-up type: In-patient    Dahlia ByesBerkelhammer, Terisa Belardo [facility] 04/22/2017, 10:25 AM

## 2017-04-22 NOTE — Lactation Note (Signed)
This note was copied from a baby's chart. Lactation Consultation Note  Patient Name: Rachael Yehuda BuddJessica Omahoney HQION'GToday's Date: 04/22/2017 Reason for consult: Follow-up assessment   Baby 46 hours old.  Mother has crack at base of L nipple.  Both nipples pink and tender. Provided mother w/ comfort gels. Reviewed hand expression and mother easily expressed flow of colostrum. Baby latched on L side in cross cradle with #20NS.  Took NS off half way through feeding. Noted colostrum in NS when unlatched. Baby then latched in football hold without NS.  Sucks and swallows observed. If mother is using NS reminded her to post pump 4-6 times per day for 10-20 min and give volume back to baby. Mother has DEBP at home.  Mom encouraged to feed baby 8-12 times/24 hours and with feeding cues.  Reviewed engorgement care and monitoring voids/stools.        Maternal Data    Feeding    LATCH Score                   Interventions    Lactation Tools Discussed/Used     Consult Status Consult Status: Follow-up Date: 04/23/17 Follow-up type: In-patient    Dahlia ByesBerkelhammer, Emiline Mancebo [facility] 04/22/2017, 12:15 PM
